# Patient Record
Sex: Female | Born: 1950 | Race: White | Hispanic: No | Smoking: Never smoker
Health system: Southern US, Community
[De-identification: ages and names within clinical notes are randomized; demographics above are authoritative.]

## PROBLEM LIST (undated history)

## (undated) DIAGNOSIS — I10 Essential (primary) hypertension: Secondary | ICD-10-CM

## (undated) DIAGNOSIS — E079 Disorder of thyroid, unspecified: Secondary | ICD-10-CM

## (undated) HISTORY — PX: TUBAL LIGATION: SHX77

---

## 2017-01-03 DIAGNOSIS — E782 Mixed hyperlipidemia: Secondary | ICD-10-CM | POA: Diagnosis not present

## 2017-01-03 DIAGNOSIS — R55 Syncope and collapse: Secondary | ICD-10-CM | POA: Diagnosis not present

## 2017-01-03 DIAGNOSIS — D539 Nutritional anemia, unspecified: Secondary | ICD-10-CM | POA: Diagnosis not present

## 2017-01-03 DIAGNOSIS — I10 Essential (primary) hypertension: Secondary | ICD-10-CM | POA: Diagnosis not present

## 2017-01-05 DIAGNOSIS — E782 Mixed hyperlipidemia: Secondary | ICD-10-CM | POA: Diagnosis not present

## 2017-01-05 DIAGNOSIS — M545 Low back pain: Secondary | ICD-10-CM | POA: Diagnosis not present

## 2017-01-05 DIAGNOSIS — I10 Essential (primary) hypertension: Secondary | ICD-10-CM | POA: Diagnosis not present

## 2017-01-05 DIAGNOSIS — D1724 Benign lipomatous neoplasm of skin and subcutaneous tissue of left leg: Secondary | ICD-10-CM | POA: Diagnosis not present

## 2017-01-05 DIAGNOSIS — Z23 Encounter for immunization: Secondary | ICD-10-CM | POA: Diagnosis not present

## 2017-07-17 DIAGNOSIS — D649 Anemia, unspecified: Secondary | ICD-10-CM | POA: Diagnosis not present

## 2017-07-17 DIAGNOSIS — E782 Mixed hyperlipidemia: Secondary | ICD-10-CM | POA: Diagnosis not present

## 2017-07-17 DIAGNOSIS — D529 Folate deficiency anemia, unspecified: Secondary | ICD-10-CM | POA: Diagnosis not present

## 2017-07-17 DIAGNOSIS — Z9189 Other specified personal risk factors, not elsewhere classified: Secondary | ICD-10-CM | POA: Diagnosis not present

## 2017-07-17 DIAGNOSIS — D519 Vitamin B12 deficiency anemia, unspecified: Secondary | ICD-10-CM | POA: Diagnosis not present

## 2017-07-17 DIAGNOSIS — I10 Essential (primary) hypertension: Secondary | ICD-10-CM | POA: Diagnosis not present

## 2017-07-19 DIAGNOSIS — Z0001 Encounter for general adult medical examination with abnormal findings: Secondary | ICD-10-CM | POA: Diagnosis not present

## 2017-07-19 DIAGNOSIS — Z23 Encounter for immunization: Secondary | ICD-10-CM | POA: Diagnosis not present

## 2017-07-19 DIAGNOSIS — D1724 Benign lipomatous neoplasm of skin and subcutaneous tissue of left leg: Secondary | ICD-10-CM | POA: Diagnosis not present

## 2017-07-19 DIAGNOSIS — Z1389 Encounter for screening for other disorder: Secondary | ICD-10-CM | POA: Diagnosis not present

## 2017-07-19 DIAGNOSIS — I10 Essential (primary) hypertension: Secondary | ICD-10-CM | POA: Diagnosis not present

## 2017-07-19 DIAGNOSIS — E782 Mixed hyperlipidemia: Secondary | ICD-10-CM | POA: Diagnosis not present

## 2017-08-29 DIAGNOSIS — D1723 Benign lipomatous neoplasm of skin and subcutaneous tissue of right leg: Secondary | ICD-10-CM | POA: Diagnosis not present

## 2017-08-29 DIAGNOSIS — I10 Essential (primary) hypertension: Secondary | ICD-10-CM | POA: Diagnosis not present

## 2017-08-29 DIAGNOSIS — M545 Low back pain: Secondary | ICD-10-CM | POA: Diagnosis not present

## 2017-08-29 DIAGNOSIS — E782 Mixed hyperlipidemia: Secondary | ICD-10-CM | POA: Diagnosis not present

## 2017-08-29 DIAGNOSIS — D1724 Benign lipomatous neoplasm of skin and subcutaneous tissue of left leg: Secondary | ICD-10-CM | POA: Diagnosis not present

## 2017-10-10 DIAGNOSIS — Z1231 Encounter for screening mammogram for malignant neoplasm of breast: Secondary | ICD-10-CM | POA: Diagnosis not present

## 2017-10-17 DIAGNOSIS — H25013 Cortical age-related cataract, bilateral: Secondary | ICD-10-CM | POA: Diagnosis not present

## 2017-11-16 DIAGNOSIS — H5703 Miosis: Secondary | ICD-10-CM | POA: Diagnosis not present

## 2017-11-16 DIAGNOSIS — H2513 Age-related nuclear cataract, bilateral: Secondary | ICD-10-CM | POA: Diagnosis not present

## 2017-11-16 DIAGNOSIS — H3509 Other intraretinal microvascular abnormalities: Secondary | ICD-10-CM | POA: Diagnosis not present

## 2017-11-16 DIAGNOSIS — H25013 Cortical age-related cataract, bilateral: Secondary | ICD-10-CM | POA: Diagnosis not present

## 2017-12-19 DIAGNOSIS — H2512 Age-related nuclear cataract, left eye: Secondary | ICD-10-CM | POA: Diagnosis not present

## 2017-12-19 DIAGNOSIS — H25812 Combined forms of age-related cataract, left eye: Secondary | ICD-10-CM | POA: Diagnosis not present

## 2018-01-10 DIAGNOSIS — H2511 Age-related nuclear cataract, right eye: Secondary | ICD-10-CM | POA: Diagnosis not present

## 2018-01-10 DIAGNOSIS — H25011 Cortical age-related cataract, right eye: Secondary | ICD-10-CM | POA: Diagnosis not present

## 2018-01-16 DIAGNOSIS — H25811 Combined forms of age-related cataract, right eye: Secondary | ICD-10-CM | POA: Diagnosis not present

## 2018-01-16 DIAGNOSIS — H2511 Age-related nuclear cataract, right eye: Secondary | ICD-10-CM | POA: Diagnosis not present

## 2018-01-24 DIAGNOSIS — I10 Essential (primary) hypertension: Secondary | ICD-10-CM | POA: Diagnosis not present

## 2018-01-24 DIAGNOSIS — E782 Mixed hyperlipidemia: Secondary | ICD-10-CM | POA: Diagnosis not present

## 2018-01-24 DIAGNOSIS — Z9189 Other specified personal risk factors, not elsewhere classified: Secondary | ICD-10-CM | POA: Diagnosis not present

## 2018-01-24 DIAGNOSIS — D529 Folate deficiency anemia, unspecified: Secondary | ICD-10-CM | POA: Diagnosis not present

## 2018-01-24 DIAGNOSIS — D519 Vitamin B12 deficiency anemia, unspecified: Secondary | ICD-10-CM | POA: Diagnosis not present

## 2018-01-26 DIAGNOSIS — I10 Essential (primary) hypertension: Secondary | ICD-10-CM | POA: Diagnosis not present

## 2018-01-26 DIAGNOSIS — E782 Mixed hyperlipidemia: Secondary | ICD-10-CM | POA: Diagnosis not present

## 2018-01-26 DIAGNOSIS — D1723 Benign lipomatous neoplasm of skin and subcutaneous tissue of right leg: Secondary | ICD-10-CM | POA: Diagnosis not present

## 2018-01-26 DIAGNOSIS — M545 Low back pain: Secondary | ICD-10-CM | POA: Diagnosis not present

## 2018-01-26 DIAGNOSIS — D1724 Benign lipomatous neoplasm of skin and subcutaneous tissue of left leg: Secondary | ICD-10-CM | POA: Diagnosis not present

## 2018-07-20 DIAGNOSIS — D519 Vitamin B12 deficiency anemia, unspecified: Secondary | ICD-10-CM | POA: Diagnosis not present

## 2018-07-20 DIAGNOSIS — D649 Anemia, unspecified: Secondary | ICD-10-CM | POA: Diagnosis not present

## 2018-07-20 DIAGNOSIS — E782 Mixed hyperlipidemia: Secondary | ICD-10-CM | POA: Diagnosis not present

## 2018-07-20 DIAGNOSIS — I10 Essential (primary) hypertension: Secondary | ICD-10-CM | POA: Diagnosis not present

## 2018-07-20 DIAGNOSIS — D529 Folate deficiency anemia, unspecified: Secondary | ICD-10-CM | POA: Diagnosis not present

## 2018-07-24 DIAGNOSIS — Z1212 Encounter for screening for malignant neoplasm of rectum: Secondary | ICD-10-CM | POA: Diagnosis not present

## 2018-07-24 DIAGNOSIS — Z0001 Encounter for general adult medical examination with abnormal findings: Secondary | ICD-10-CM | POA: Diagnosis not present

## 2018-07-24 DIAGNOSIS — Z6823 Body mass index (BMI) 23.0-23.9, adult: Secondary | ICD-10-CM | POA: Diagnosis not present

## 2018-07-24 DIAGNOSIS — Z23 Encounter for immunization: Secondary | ICD-10-CM | POA: Diagnosis not present

## 2018-07-24 DIAGNOSIS — I10 Essential (primary) hypertension: Secondary | ICD-10-CM | POA: Diagnosis not present

## 2018-08-14 DIAGNOSIS — M8588 Other specified disorders of bone density and structure, other site: Secondary | ICD-10-CM | POA: Diagnosis not present

## 2018-08-14 DIAGNOSIS — M85832 Other specified disorders of bone density and structure, left forearm: Secondary | ICD-10-CM | POA: Diagnosis not present

## 2018-09-10 DIAGNOSIS — E039 Hypothyroidism, unspecified: Secondary | ICD-10-CM | POA: Diagnosis not present

## 2018-09-18 DIAGNOSIS — Z961 Presence of intraocular lens: Secondary | ICD-10-CM | POA: Diagnosis not present

## 2018-11-13 DIAGNOSIS — N329 Bladder disorder, unspecified: Secondary | ICD-10-CM | POA: Diagnosis not present

## 2018-12-06 DIAGNOSIS — N329 Bladder disorder, unspecified: Secondary | ICD-10-CM | POA: Diagnosis not present

## 2019-01-18 DIAGNOSIS — I10 Essential (primary) hypertension: Secondary | ICD-10-CM | POA: Diagnosis not present

## 2019-01-18 DIAGNOSIS — E039 Hypothyroidism, unspecified: Secondary | ICD-10-CM | POA: Diagnosis not present

## 2019-01-18 DIAGNOSIS — D529 Folate deficiency anemia, unspecified: Secondary | ICD-10-CM | POA: Diagnosis not present

## 2019-01-18 DIAGNOSIS — D649 Anemia, unspecified: Secondary | ICD-10-CM | POA: Diagnosis not present

## 2019-01-18 DIAGNOSIS — D519 Vitamin B12 deficiency anemia, unspecified: Secondary | ICD-10-CM | POA: Diagnosis not present

## 2019-01-21 DIAGNOSIS — I10 Essential (primary) hypertension: Secondary | ICD-10-CM | POA: Diagnosis not present

## 2019-01-21 DIAGNOSIS — M545 Low back pain: Secondary | ICD-10-CM | POA: Diagnosis not present

## 2019-01-21 DIAGNOSIS — E039 Hypothyroidism, unspecified: Secondary | ICD-10-CM | POA: Diagnosis not present

## 2019-01-21 DIAGNOSIS — E782 Mixed hyperlipidemia: Secondary | ICD-10-CM | POA: Diagnosis not present

## 2019-01-21 DIAGNOSIS — D1724 Benign lipomatous neoplasm of skin and subcutaneous tissue of left leg: Secondary | ICD-10-CM | POA: Diagnosis not present

## 2019-07-19 DIAGNOSIS — I10 Essential (primary) hypertension: Secondary | ICD-10-CM | POA: Diagnosis not present

## 2019-07-19 DIAGNOSIS — E782 Mixed hyperlipidemia: Secondary | ICD-10-CM | POA: Diagnosis not present

## 2019-07-26 DIAGNOSIS — E039 Hypothyroidism, unspecified: Secondary | ICD-10-CM | POA: Diagnosis not present

## 2019-07-26 DIAGNOSIS — D519 Vitamin B12 deficiency anemia, unspecified: Secondary | ICD-10-CM | POA: Diagnosis not present

## 2019-07-26 DIAGNOSIS — I10 Essential (primary) hypertension: Secondary | ICD-10-CM | POA: Diagnosis not present

## 2019-07-26 DIAGNOSIS — E782 Mixed hyperlipidemia: Secondary | ICD-10-CM | POA: Diagnosis not present

## 2019-07-26 DIAGNOSIS — D529 Folate deficiency anemia, unspecified: Secondary | ICD-10-CM | POA: Diagnosis not present

## 2019-07-29 DIAGNOSIS — I10 Essential (primary) hypertension: Secondary | ICD-10-CM | POA: Diagnosis not present

## 2019-07-29 DIAGNOSIS — D1724 Benign lipomatous neoplasm of skin and subcutaneous tissue of left leg: Secondary | ICD-10-CM | POA: Diagnosis not present

## 2019-07-29 DIAGNOSIS — E039 Hypothyroidism, unspecified: Secondary | ICD-10-CM | POA: Diagnosis not present

## 2019-07-29 DIAGNOSIS — Z0001 Encounter for general adult medical examination with abnormal findings: Secondary | ICD-10-CM | POA: Diagnosis not present

## 2019-07-29 DIAGNOSIS — Z23 Encounter for immunization: Secondary | ICD-10-CM | POA: Diagnosis not present

## 2019-07-29 DIAGNOSIS — Z6823 Body mass index (BMI) 23.0-23.9, adult: Secondary | ICD-10-CM | POA: Diagnosis not present

## 2019-07-29 DIAGNOSIS — E782 Mixed hyperlipidemia: Secondary | ICD-10-CM | POA: Diagnosis not present

## 2019-07-29 DIAGNOSIS — Z1212 Encounter for screening for malignant neoplasm of rectum: Secondary | ICD-10-CM | POA: Diagnosis not present

## 2019-07-30 ENCOUNTER — Other Ambulatory Visit: Payer: Self-pay

## 2019-07-30 NOTE — Patient Outreach (Signed)
[facility]) Care Management  07/30/2019  ILDA KREITZ [birth year] [id number]   Medication Adherence call to Mrs. Gwenlyn Perking patients telephone number belongs to someone else patient is showing past due on Lovastatin 20 mg and Lisinopril/Hctz 10/12.5 mg under [facility].   [facility] Management Direct Dial [phone]  Fax [phone] [email or web address]

## 2019-08-19 DIAGNOSIS — E039 Hypothyroidism, unspecified: Secondary | ICD-10-CM | POA: Diagnosis not present

## 2019-08-19 DIAGNOSIS — E782 Mixed hyperlipidemia: Secondary | ICD-10-CM | POA: Diagnosis not present

## 2019-08-19 DIAGNOSIS — I10 Essential (primary) hypertension: Secondary | ICD-10-CM | POA: Diagnosis not present

## 2019-09-19 DIAGNOSIS — E782 Mixed hyperlipidemia: Secondary | ICD-10-CM | POA: Diagnosis not present

## 2019-09-19 DIAGNOSIS — I10 Essential (primary) hypertension: Secondary | ICD-10-CM | POA: Diagnosis not present

## 2019-09-30 DIAGNOSIS — H5212 Myopia, left eye: Secondary | ICD-10-CM | POA: Diagnosis not present

## 2019-10-11 DIAGNOSIS — Z1231 Encounter for screening mammogram for malignant neoplasm of breast: Secondary | ICD-10-CM | POA: Diagnosis not present

## 2019-10-18 DIAGNOSIS — E039 Hypothyroidism, unspecified: Secondary | ICD-10-CM | POA: Diagnosis not present

## 2019-10-18 DIAGNOSIS — I10 Essential (primary) hypertension: Secondary | ICD-10-CM | POA: Diagnosis not present

## 2019-11-13 DIAGNOSIS — N6321 Unspecified lump in the left breast, upper outer quadrant: Secondary | ICD-10-CM | POA: Diagnosis not present

## 2019-11-13 DIAGNOSIS — N6002 Solitary cyst of left breast: Secondary | ICD-10-CM | POA: Diagnosis not present

## 2019-11-13 DIAGNOSIS — N6489 Other specified disorders of breast: Secondary | ICD-10-CM | POA: Diagnosis not present

## 2019-11-13 DIAGNOSIS — R922 Inconclusive mammogram: Secondary | ICD-10-CM | POA: Diagnosis not present

## 2019-11-14 DIAGNOSIS — M25561 Pain in right knee: Secondary | ICD-10-CM | POA: Diagnosis not present

## 2019-11-14 DIAGNOSIS — R21 Rash and other nonspecific skin eruption: Secondary | ICD-10-CM | POA: Diagnosis not present

## 2019-11-15 DIAGNOSIS — E7849 Other hyperlipidemia: Secondary | ICD-10-CM | POA: Diagnosis not present

## 2019-11-15 DIAGNOSIS — I10 Essential (primary) hypertension: Secondary | ICD-10-CM | POA: Diagnosis not present

## 2019-11-27 DIAGNOSIS — N6002 Solitary cyst of left breast: Secondary | ICD-10-CM | POA: Diagnosis not present

## 2019-12-16 DIAGNOSIS — M7061 Trochanteric bursitis, right hip: Secondary | ICD-10-CM | POA: Diagnosis not present

## 2019-12-16 DIAGNOSIS — Z6823 Body mass index (BMI) 23.0-23.9, adult: Secondary | ICD-10-CM | POA: Diagnosis not present

## 2019-12-16 DIAGNOSIS — M7062 Trochanteric bursitis, left hip: Secondary | ICD-10-CM | POA: Diagnosis not present

## 2019-12-16 DIAGNOSIS — M25561 Pain in right knee: Secondary | ICD-10-CM | POA: Diagnosis not present

## 2019-12-16 DIAGNOSIS — E782 Mixed hyperlipidemia: Secondary | ICD-10-CM | POA: Diagnosis not present

## 2019-12-18 DIAGNOSIS — I10 Essential (primary) hypertension: Secondary | ICD-10-CM | POA: Diagnosis not present

## 2019-12-18 DIAGNOSIS — E7849 Other hyperlipidemia: Secondary | ICD-10-CM | POA: Diagnosis not present

## 2020-01-24 DIAGNOSIS — I10 Essential (primary) hypertension: Secondary | ICD-10-CM | POA: Diagnosis not present

## 2020-01-24 DIAGNOSIS — E782 Mixed hyperlipidemia: Secondary | ICD-10-CM | POA: Diagnosis not present

## 2020-01-31 DIAGNOSIS — E039 Hypothyroidism, unspecified: Secondary | ICD-10-CM | POA: Diagnosis not present

## 2020-01-31 DIAGNOSIS — M545 Low back pain: Secondary | ICD-10-CM | POA: Diagnosis not present

## 2020-01-31 DIAGNOSIS — E782 Mixed hyperlipidemia: Secondary | ICD-10-CM | POA: Diagnosis not present

## 2020-01-31 DIAGNOSIS — Z6823 Body mass index (BMI) 23.0-23.9, adult: Secondary | ICD-10-CM | POA: Diagnosis not present

## 2020-01-31 DIAGNOSIS — I10 Essential (primary) hypertension: Secondary | ICD-10-CM | POA: Diagnosis not present

## 2020-01-31 DIAGNOSIS — D1724 Benign lipomatous neoplasm of skin and subcutaneous tissue of left leg: Secondary | ICD-10-CM | POA: Diagnosis not present

## 2020-02-17 DIAGNOSIS — E039 Hypothyroidism, unspecified: Secondary | ICD-10-CM | POA: Diagnosis not present

## 2020-02-17 DIAGNOSIS — I10 Essential (primary) hypertension: Secondary | ICD-10-CM | POA: Diagnosis not present

## 2020-02-17 DIAGNOSIS — E7849 Other hyperlipidemia: Secondary | ICD-10-CM | POA: Diagnosis not present

## 2020-02-17 DIAGNOSIS — D649 Anemia, unspecified: Secondary | ICD-10-CM | POA: Diagnosis not present

## 2020-03-18 DIAGNOSIS — D649 Anemia, unspecified: Secondary | ICD-10-CM | POA: Diagnosis not present

## 2020-03-18 DIAGNOSIS — E039 Hypothyroidism, unspecified: Secondary | ICD-10-CM | POA: Diagnosis not present

## 2020-03-18 DIAGNOSIS — E7849 Other hyperlipidemia: Secondary | ICD-10-CM | POA: Diagnosis not present

## 2020-03-18 DIAGNOSIS — I10 Essential (primary) hypertension: Secondary | ICD-10-CM | POA: Diagnosis not present

## 2020-05-19 DIAGNOSIS — I10 Essential (primary) hypertension: Secondary | ICD-10-CM | POA: Diagnosis not present

## 2020-05-19 DIAGNOSIS — E7849 Other hyperlipidemia: Secondary | ICD-10-CM | POA: Diagnosis not present

## 2020-05-19 DIAGNOSIS — D649 Anemia, unspecified: Secondary | ICD-10-CM | POA: Diagnosis not present

## 2020-05-19 DIAGNOSIS — E039 Hypothyroidism, unspecified: Secondary | ICD-10-CM | POA: Diagnosis not present

## 2020-06-18 DIAGNOSIS — E039 Hypothyroidism, unspecified: Secondary | ICD-10-CM | POA: Diagnosis not present

## 2020-06-18 DIAGNOSIS — E7849 Other hyperlipidemia: Secondary | ICD-10-CM | POA: Diagnosis not present

## 2020-06-18 DIAGNOSIS — D649 Anemia, unspecified: Secondary | ICD-10-CM | POA: Diagnosis not present

## 2020-06-18 DIAGNOSIS — I10 Essential (primary) hypertension: Secondary | ICD-10-CM | POA: Diagnosis not present

## 2020-08-11 DIAGNOSIS — E039 Hypothyroidism, unspecified: Secondary | ICD-10-CM | POA: Diagnosis not present

## 2020-08-11 DIAGNOSIS — E782 Mixed hyperlipidemia: Secondary | ICD-10-CM | POA: Diagnosis not present

## 2020-08-11 DIAGNOSIS — I10 Essential (primary) hypertension: Secondary | ICD-10-CM | POA: Diagnosis not present

## 2020-08-11 DIAGNOSIS — D649 Anemia, unspecified: Secondary | ICD-10-CM | POA: Diagnosis not present

## 2020-08-18 DIAGNOSIS — E782 Mixed hyperlipidemia: Secondary | ICD-10-CM | POA: Diagnosis not present

## 2020-08-18 DIAGNOSIS — I10 Essential (primary) hypertension: Secondary | ICD-10-CM | POA: Diagnosis not present

## 2020-08-18 DIAGNOSIS — Z0001 Encounter for general adult medical examination with abnormal findings: Secondary | ICD-10-CM | POA: Diagnosis not present

## 2020-08-18 DIAGNOSIS — E7849 Other hyperlipidemia: Secondary | ICD-10-CM | POA: Diagnosis not present

## 2020-08-18 DIAGNOSIS — Z23 Encounter for immunization: Secondary | ICD-10-CM | POA: Diagnosis not present

## 2020-08-18 DIAGNOSIS — E039 Hypothyroidism, unspecified: Secondary | ICD-10-CM | POA: Diagnosis not present

## 2020-08-18 DIAGNOSIS — Z1212 Encounter for screening for malignant neoplasm of rectum: Secondary | ICD-10-CM | POA: Diagnosis not present

## 2020-08-18 DIAGNOSIS — Z6824 Body mass index (BMI) 24.0-24.9, adult: Secondary | ICD-10-CM | POA: Diagnosis not present

## 2020-08-18 DIAGNOSIS — D1724 Benign lipomatous neoplasm of skin and subcutaneous tissue of left leg: Secondary | ICD-10-CM | POA: Diagnosis not present

## 2020-09-18 DIAGNOSIS — E039 Hypothyroidism, unspecified: Secondary | ICD-10-CM | POA: Diagnosis not present

## 2020-09-18 DIAGNOSIS — E7849 Other hyperlipidemia: Secondary | ICD-10-CM | POA: Diagnosis not present

## 2020-09-18 DIAGNOSIS — D649 Anemia, unspecified: Secondary | ICD-10-CM | POA: Diagnosis not present

## 2020-09-18 DIAGNOSIS — I10 Essential (primary) hypertension: Secondary | ICD-10-CM | POA: Diagnosis not present

## 2020-10-17 DIAGNOSIS — D649 Anemia, unspecified: Secondary | ICD-10-CM | POA: Diagnosis not present

## 2020-10-17 DIAGNOSIS — E039 Hypothyroidism, unspecified: Secondary | ICD-10-CM | POA: Diagnosis not present

## 2020-10-17 DIAGNOSIS — E7849 Other hyperlipidemia: Secondary | ICD-10-CM | POA: Diagnosis not present

## 2020-10-17 DIAGNOSIS — I10 Essential (primary) hypertension: Secondary | ICD-10-CM | POA: Diagnosis not present

## 2020-11-16 DIAGNOSIS — D649 Anemia, unspecified: Secondary | ICD-10-CM | POA: Diagnosis not present

## 2020-11-16 DIAGNOSIS — E039 Hypothyroidism, unspecified: Secondary | ICD-10-CM | POA: Diagnosis not present

## 2020-11-16 DIAGNOSIS — E7849 Other hyperlipidemia: Secondary | ICD-10-CM | POA: Diagnosis not present

## 2020-11-16 DIAGNOSIS — I10 Essential (primary) hypertension: Secondary | ICD-10-CM | POA: Diagnosis not present

## 2020-11-28 DIAGNOSIS — J069 Acute upper respiratory infection, unspecified: Secondary | ICD-10-CM | POA: Diagnosis not present

## 2020-11-28 DIAGNOSIS — J40 Bronchitis, not specified as acute or chronic: Secondary | ICD-10-CM | POA: Diagnosis not present

## 2020-12-15 DIAGNOSIS — Z1231 Encounter for screening mammogram for malignant neoplasm of breast: Secondary | ICD-10-CM | POA: Diagnosis not present

## 2020-12-16 DIAGNOSIS — E039 Hypothyroidism, unspecified: Secondary | ICD-10-CM | POA: Diagnosis not present

## 2020-12-16 DIAGNOSIS — D649 Anemia, unspecified: Secondary | ICD-10-CM | POA: Diagnosis not present

## 2020-12-16 DIAGNOSIS — I10 Essential (primary) hypertension: Secondary | ICD-10-CM | POA: Diagnosis not present

## 2020-12-16 DIAGNOSIS — E7849 Other hyperlipidemia: Secondary | ICD-10-CM | POA: Diagnosis not present

## 2021-01-15 DIAGNOSIS — H524 Presbyopia: Secondary | ICD-10-CM | POA: Diagnosis not present

## 2021-02-08 DIAGNOSIS — E7849 Other hyperlipidemia: Secondary | ICD-10-CM | POA: Diagnosis not present

## 2021-02-08 DIAGNOSIS — E782 Mixed hyperlipidemia: Secondary | ICD-10-CM | POA: Diagnosis not present

## 2021-02-08 DIAGNOSIS — E039 Hypothyroidism, unspecified: Secondary | ICD-10-CM | POA: Diagnosis not present

## 2021-02-08 DIAGNOSIS — D519 Vitamin B12 deficiency anemia, unspecified: Secondary | ICD-10-CM | POA: Diagnosis not present

## 2021-02-08 DIAGNOSIS — I10 Essential (primary) hypertension: Secondary | ICD-10-CM | POA: Diagnosis not present

## 2021-02-11 DIAGNOSIS — E039 Hypothyroidism, unspecified: Secondary | ICD-10-CM | POA: Diagnosis not present

## 2021-02-11 DIAGNOSIS — Z6823 Body mass index (BMI) 23.0-23.9, adult: Secondary | ICD-10-CM | POA: Diagnosis not present

## 2021-02-11 DIAGNOSIS — S39012A Strain of muscle, fascia and tendon of lower back, initial encounter: Secondary | ICD-10-CM | POA: Diagnosis not present

## 2021-02-11 DIAGNOSIS — Z23 Encounter for immunization: Secondary | ICD-10-CM | POA: Diagnosis not present

## 2021-02-11 DIAGNOSIS — I10 Essential (primary) hypertension: Secondary | ICD-10-CM | POA: Diagnosis not present

## 2021-02-11 DIAGNOSIS — E7849 Other hyperlipidemia: Secondary | ICD-10-CM | POA: Diagnosis not present

## 2021-02-11 DIAGNOSIS — D1724 Benign lipomatous neoplasm of skin and subcutaneous tissue of left leg: Secondary | ICD-10-CM | POA: Diagnosis not present

## 2021-03-08 DIAGNOSIS — H35362 Drusen (degenerative) of macula, left eye: Secondary | ICD-10-CM | POA: Diagnosis not present

## 2021-03-08 DIAGNOSIS — H26493 Other secondary cataract, bilateral: Secondary | ICD-10-CM | POA: Diagnosis not present

## 2021-03-08 DIAGNOSIS — H35033 Hypertensive retinopathy, bilateral: Secondary | ICD-10-CM | POA: Diagnosis not present

## 2021-03-08 DIAGNOSIS — H5703 Miosis: Secondary | ICD-10-CM | POA: Diagnosis not present

## 2021-03-18 DIAGNOSIS — E039 Hypothyroidism, unspecified: Secondary | ICD-10-CM | POA: Diagnosis not present

## 2021-03-18 DIAGNOSIS — E7849 Other hyperlipidemia: Secondary | ICD-10-CM | POA: Diagnosis not present

## 2021-03-18 DIAGNOSIS — I10 Essential (primary) hypertension: Secondary | ICD-10-CM | POA: Diagnosis not present

## 2021-03-18 DIAGNOSIS — D649 Anemia, unspecified: Secondary | ICD-10-CM | POA: Diagnosis not present

## 2021-04-01 DIAGNOSIS — H26492 Other secondary cataract, left eye: Secondary | ICD-10-CM | POA: Diagnosis not present

## 2021-08-20 DIAGNOSIS — I10 Essential (primary) hypertension: Secondary | ICD-10-CM | POA: Diagnosis not present

## 2021-08-20 DIAGNOSIS — Z0001 Encounter for general adult medical examination with abnormal findings: Secondary | ICD-10-CM | POA: Diagnosis not present

## 2021-08-20 DIAGNOSIS — E782 Mixed hyperlipidemia: Secondary | ICD-10-CM | POA: Diagnosis not present

## 2021-08-20 DIAGNOSIS — E039 Hypothyroidism, unspecified: Secondary | ICD-10-CM | POA: Diagnosis not present

## 2021-08-20 DIAGNOSIS — E7849 Other hyperlipidemia: Secondary | ICD-10-CM | POA: Diagnosis not present

## 2021-08-24 DIAGNOSIS — E039 Hypothyroidism, unspecified: Secondary | ICD-10-CM | POA: Diagnosis not present

## 2021-08-24 DIAGNOSIS — Z6824 Body mass index (BMI) 24.0-24.9, adult: Secondary | ICD-10-CM | POA: Diagnosis not present

## 2021-08-24 DIAGNOSIS — Z23 Encounter for immunization: Secondary | ICD-10-CM | POA: Diagnosis not present

## 2021-08-24 DIAGNOSIS — D1724 Benign lipomatous neoplasm of skin and subcutaneous tissue of left leg: Secondary | ICD-10-CM | POA: Diagnosis not present

## 2021-08-24 DIAGNOSIS — E7849 Other hyperlipidemia: Secondary | ICD-10-CM | POA: Diagnosis not present

## 2021-08-24 DIAGNOSIS — Z1212 Encounter for screening for malignant neoplasm of rectum: Secondary | ICD-10-CM | POA: Diagnosis not present

## 2021-08-24 DIAGNOSIS — Z0001 Encounter for general adult medical examination with abnormal findings: Secondary | ICD-10-CM | POA: Diagnosis not present

## 2021-08-24 DIAGNOSIS — I10 Essential (primary) hypertension: Secondary | ICD-10-CM | POA: Diagnosis not present

## 2021-09-07 DIAGNOSIS — H26493 Other secondary cataract, bilateral: Secondary | ICD-10-CM | POA: Diagnosis not present

## 2021-09-13 DIAGNOSIS — L570 Actinic keratosis: Secondary | ICD-10-CM | POA: Diagnosis not present

## 2022-01-15 ENCOUNTER — Emergency Department (HOSPITAL_COMMUNITY): Payer: Medicare Other

## 2022-01-15 ENCOUNTER — Emergency Department (HOSPITAL_COMMUNITY)
Admission: EM | Admit: 2022-01-15 | Discharge: 2022-01-15 | Disposition: A | Discharge status: Home / Self Care | Payer: Medicare Other | Attending: Student | Admitting: Student

## 2022-01-15 ENCOUNTER — Other Ambulatory Visit: Payer: Self-pay

## 2022-01-15 ENCOUNTER — Encounter (HOSPITAL_COMMUNITY): Payer: Self-pay

## 2022-01-15 DIAGNOSIS — I10 Essential (primary) hypertension: Secondary | ICD-10-CM | POA: Diagnosis not present

## 2022-01-15 DIAGNOSIS — E039 Hypothyroidism, unspecified: Secondary | ICD-10-CM | POA: Diagnosis not present

## 2022-01-15 DIAGNOSIS — R0789 Other chest pain: Secondary | ICD-10-CM | POA: Diagnosis not present

## 2022-01-15 DIAGNOSIS — J9811 Atelectasis: Secondary | ICD-10-CM | POA: Diagnosis not present

## 2022-01-15 DIAGNOSIS — R079 Chest pain, unspecified: Secondary | ICD-10-CM | POA: Insufficient documentation

## 2022-01-15 HISTORY — DX: Essential (primary) hypertension: I10

## 2022-01-15 LAB — CBC WITH DIFFERENTIAL/PLATELET
Abs Immature Granulocytes: 0.01 10*3/uL (ref 0.00–0.07)
Basophils Absolute: 0.1 10*3/uL (ref 0.0–0.1)
Basophils Relative: 1 %
Eosinophils Absolute: 0.1 10*3/uL (ref 0.0–0.5)
Eosinophils Relative: 1 %
HCT: 38.7 % (ref 36.0–46.0)
Hemoglobin: 13.2 g/dL (ref 12.0–15.0)
Immature Granulocytes: 0 %
Lymphocytes Relative: 25 %
Lymphs Abs: 1.7 10*3/uL (ref 0.7–4.0)
MCH: 30.8 pg (ref 26.0–34.0)
MCHC: 34.1 g/dL (ref 30.0–36.0)
MCV: 90.4 fL (ref 80.0–100.0)
Monocytes Absolute: 0.5 10*3/uL (ref 0.1–1.0)
Monocytes Relative: 7 %
Neutro Abs: 4.4 10*3/uL (ref 1.7–7.7)
Neutrophils Relative %: 66 %
Platelets: 184 10*3/uL (ref 150–400)
RBC: 4.28 MIL/uL (ref 3.87–5.11)
RDW: 13.5 % (ref 11.5–15.5)
WBC: 6.7 10*3/uL (ref 4.0–10.5)
nRBC: 0 % (ref 0.0–0.2)

## 2022-01-15 LAB — BASIC METABOLIC PANEL
Anion gap: 6 (ref 5–15)
BUN: 19 mg/dL (ref 8–23)
CO2: 26 mmol/L (ref 22–32)
Calcium: 9.5 mg/dL (ref 8.9–10.3)
Chloride: 108 mmol/L (ref 98–111)
Creatinine, Ser: 1.04 mg/dL — ABNORMAL HIGH (ref 0.44–1.00)
GFR, Estimated: 58 mL/min — ABNORMAL LOW (ref >60)
Glucose, Bld: 110 mg/dL — ABNORMAL HIGH (ref 70–99)
Potassium: 3.3 mmol/L — ABNORMAL LOW (ref 3.5–5.1)
Sodium: 140 mmol/L (ref 135–145)

## 2022-01-15 LAB — TROPONIN I (HIGH SENSITIVITY)
Troponin I (High Sensitivity): 4 ng/L (ref <18)
Troponin I (High Sensitivity): 5 ng/L (ref <18)

## 2022-01-15 MED ORDER — IOHEXOL 350 MG/ML SOLN
75.0000 mL | Freq: Once | INTRAVENOUS | Status: AC | PRN
  Administered 2022-01-15: 75 mL via INTRAVENOUS

## 2022-01-15 NOTE — Discharge Instructions (Signed)
Your work-up today was reassuring.  I would like for you to follow-up with your primary care doctor sometime within the next week or so.  Please call him Monday and see if and get your appointment moved up.  Please return to the [facility] apartment for any worsening symptoms you might have. ?

## 2022-01-15 NOTE — ED Triage Notes (Signed)
Patient reports that she woke with left sided chest pain this. Has taken '324mg'$  ASA.  ?

## 2022-01-15 NOTE — ED Provider Notes (Signed)
?[place] ?Provider Note ? ? ?CSN: [id number] ?Arrival date & time: 01/15/22  1755 ? ?  ? ?History ? ?Chief Complaint  ?Patient presents with  ? Chest Pain  ? ? ?Veronica Peck is a 72 y.o. female with history of hypertension, hyperlipidemia, and hypothyroidism who presents to the emergency department today with left-sided chest pain that has been constant since he woke up around 8 AM.  Nothing seems to make this pain better or worse.  She describes as a dull ache.  She denies nausea, vomiting, diarrhea, chills, diaphoresis, abdominal pain, shortness of breath.  Patient took 4 aspirin prior to arrival with little improvement. ? ? ?Chest Pain ? ?  ? ?Home Medications ?Prior to Admission medications   ?Not on File  ?   ? ?Allergies    ?Silvadene [silver sulfadiazine]   ? ?Review of Systems   ?Review of Systems  ?Cardiovascular:  Positive for chest pain.  ?All other systems reviewed and are negative. ? ?Physical Exam ?Updated Vital Signs ?BP 123/80   Pulse 72   Temp 98.8 ?F (37.1 ?C) (Oral)   Resp 15   Ht '5\' 5"'$  (1.651 m)   Wt 62.6 kg   SpO2 97%   BMI 22.96 kg/m?  ?Physical Exam ?Vitals and nursing note reviewed.  ?Constitutional:   ?   General: She is not in acute distress. ?   Appearance: Normal appearance.  ?HENT:  ?   Head: Normocephalic and atraumatic.  ?Eyes:  ?   General:     ?   Right eye: No discharge.     ?   Left eye: No discharge.  ?Cardiovascular:  ?   Comments: Regular rate and rhythm.  S1/S2 are distinct without any evidence of murmur, rubs, or gallops.  Radial pulses are 2+ bilaterally.  Dorsalis pedis pulses are 2+ bilaterally.  No evidence of pedal edema. ?Pulmonary:  ?   Comments: Clear to auscultation bilaterally.  Normal effort.  No respiratory distress.  No evidence of wheezes, rales, or rhonchi heard throughout. ?Abdominal:  ?   General: Abdomen is flat. Bowel sounds are normal. There is no distension.  ?   Tenderness: There is no abdominal tenderness. There is no  guarding or rebound.  ?Musculoskeletal:     ?   General: Normal range of motion.  ?   Cervical back: Neck supple.  ?Skin: ?   General: Skin is warm and dry.  ?   Findings: No rash.  ?Neurological:  ?   General: No focal deficit present.  ?   Mental Status: She is alert.  ?Psychiatric:     ?   Mood and Affect: Mood normal.     ?   Behavior: Behavior normal.  ? ? ?ED Results / Procedures / Treatments   ?Labs ?(all labs ordered are listed, but only abnormal results are displayed) ?Labs Reviewed  ?BASIC METABOLIC PANEL - Abnormal; Notable for the following components:  ?    Result Value  ? Potassium 3.3 (*)   ? Glucose, Bld 110 (*)   ? Creatinine, Ser 1.04 (*)   ? GFR, Estimated 58 (*)   ? All other components within normal limits  ?CBC WITH DIFFERENTIAL/PLATELET  ?TROPONIN I (HIGH SENSITIVITY)  ?TROPONIN I (HIGH SENSITIVITY)  ? ? ?EKG ?None ? ?Radiology ?DG Chest 2 View ? ?Result Date: 01/15/2022 ?CLINICAL DATA:  Chest pain EXAM: CHEST - 2 VIEW COMPARISON:  None. FINDINGS: Cardiac size is within normal limits. Thoracic aorta is  tortuous and ectatic. Lung fields are clear of any pulmonary edema or focal consolidation. There is no pleural effusion or pneumothorax. IMPRESSION: No active cardiopulmonary disease. Electronically Signed   By: Elmer Picker M.D.   On: 01/15/2022 18:57  ? ?CT Angio Chest PE W and/or Wo Contrast ? ?Result Date: 01/15/2022 ?CLINICAL DATA:  Left chest pain EXAM: CT ANGIOGRAPHY CHEST WITH CONTRAST TECHNIQUE: Multidetector CT imaging of the chest was performed using the standard protocol during bolus administration of intravenous contrast. Multiplanar CT image reconstructions and MIPs were obtained to evaluate the vascular anatomy. RADIATION DOSE REDUCTION: This exam was performed according to the departmental dose-optimization program which includes automated exposure control, adjustment of the mA and/or kV according to patient size and/or use of iterative reconstruction technique. CONTRAST:   37m OMNIPAQUE IOHEXOL 350 MG/ML SOLN COMPARISON:  Chest radiograph dated 01/13/2022 FINDINGS: Cardiovascular: Satisfactory opacification of the bilateral pulmonary arteries to the segmental level. Evaluation is mildly limited by respiratory motion. Within that constraint, there is no evidence of pulmonary embolism. Although not tailored for evaluation of the thoracic aorta, there is no evidence of thoracic aortic aneurysm or dissection. Atherosclerotic calcifications of the aortic arch. The heart is normal in size.  No pericardial effusion. Mediastinum/Nodes: No suspicious mediastinal lymphadenopathy. Visualized thyroid is unremarkable. Lungs/Pleura: Mild dependent atelectasis in the bilateral lower lobes. No focal consolidation. Evaluation lung parenchyma is mildly constrained by respiratory motion. Within that constraint, there are no suspicious pulmonary nodules. No pleural effusion or pneumothorax. Upper Abdomen: Visualized upper abdomen is grossly unremarkable. Musculoskeletal: Degenerative changes of the visualized thoracolumbar spine. Review of the MIP images confirms the above findings. IMPRESSION: No evidence of pulmonary embolism. No evidence of acute cardiopulmonary disease. Aortic Atherosclerosis (ICD10-I70.0). Electronically Signed   By: SJulian HyM.D.   On: 01/15/2022 22:01   ? ?Procedures ?Procedures  ? ? ?Medications Ordered in ED ?Medications  ?iohexol (OMNIPAQUE) 350 MG/ML injection 75 mL (75 mLs Intravenous Contrast Given 01/15/22 2147)  ? ? ?ED Course/ Medical Decision Making/ A&P ?Clinical Course as of 01/15/22 2227  ?Sat Jan 15, 2022  ?2227 I discussed this case with my attending physician who cosigned this note including patient's presenting symptoms, physical exam, and planned diagnostics and interventions. Attending physician stated agreement with plan or made changes to plan which were implemented.  ? ? [CF]  ?  ?Clinical Course User Index ?[CF] FMyna BrightM, PA-C  ? ?                         ?Medical Decision Making ?Amount and/or Complexity of Data Reviewed ?Labs: ordered. ?Radiology: ordered. ? ?Risk ?Prescription drug management. ? ? ?This patient presents to the ED for concern of left-sided chest pain, this involves an extensive number of treatment options, and is a complaint that carries with it a high risk of complications and morbidity.  The differential diagnosis includes ACS, dissection although less likely given her normal blood pressure, pulmonary embolism less likely she is not having any shortness of breath or evidence of tachycardia or syncope, pneumonia although unlikely as she is not having any cough or fever, pneumothorax and there is no unilateral breath sounds. ? ? ?Co morbidities that complicate the patient evaluation ? ?Past Medical History:  ?Diagnosis Date  ? Hypertension   ? ? ?Additional history obtained: ? ?Additional history obtained from nursing note.  External records reviewed and there is no records related to her visits today.  Providers are not in  the system ? ?Lab Tests: ? ?I Ordered, and personally interpreted labs.  The pertinent results include: CBC is normal.  BMP shows mild hypokalemia and elevated glucose.  Initial and delta troponin are negative. ? ? ?Imaging Studies ordered: ? ?I ordered imaging studies including chest x-ray and CT pulmonary embolism study ?I independently visualized and interpreted imaging which showed chest x-ray is negative for any pneumothorax or pneumonia.  CT angiography shows no pulmonary embolism ?I agree with the radiologist interpretation ? ? ?Cardiac Monitoring: ? ?The patient was maintained on a cardiac monitor.  I personally viewed and interpreted the cardiac monitored which showed an underlying rhythm of: Normal sinus rhythm ? ? ?Test Considered: ? ?N/A ? ? ?Critical Interventions: ? ?N/A ? ? ?Consultations Obtained: ? ?N/A ? ? ?Problem List / ED Course: ? ?Patient presents to the emergency department today with chest  pain.  Patient does not have a whole lot of risk factors for ACS apart from hypertension.  Work-up today is reassuring.  Troponins were negative x2.  No evidence of pulmonary embolism.  I will have her follo

## 2022-01-15 NOTE — ED Notes (Signed)
Patient transported to CT 

## 2022-02-23 DIAGNOSIS — I10 Essential (primary) hypertension: Secondary | ICD-10-CM | POA: Diagnosis not present

## 2022-02-23 DIAGNOSIS — E7849 Other hyperlipidemia: Secondary | ICD-10-CM | POA: Diagnosis not present

## 2022-02-23 DIAGNOSIS — E782 Mixed hyperlipidemia: Secondary | ICD-10-CM | POA: Diagnosis not present

## 2022-03-03 DIAGNOSIS — M545 Low back pain, unspecified: Secondary | ICD-10-CM | POA: Diagnosis not present

## 2022-03-03 DIAGNOSIS — Z6824 Body mass index (BMI) 24.0-24.9, adult: Secondary | ICD-10-CM | POA: Diagnosis not present

## 2022-03-03 DIAGNOSIS — E7849 Other hyperlipidemia: Secondary | ICD-10-CM | POA: Diagnosis not present

## 2022-03-03 DIAGNOSIS — I10 Essential (primary) hypertension: Secondary | ICD-10-CM | POA: Diagnosis not present

## 2022-03-03 DIAGNOSIS — D1724 Benign lipomatous neoplasm of skin and subcutaneous tissue of left leg: Secondary | ICD-10-CM | POA: Diagnosis not present

## 2022-03-03 DIAGNOSIS — E039 Hypothyroidism, unspecified: Secondary | ICD-10-CM | POA: Diagnosis not present

## 2022-09-02 DIAGNOSIS — D519 Vitamin B12 deficiency anemia, unspecified: Secondary | ICD-10-CM | POA: Diagnosis not present

## 2022-09-02 DIAGNOSIS — I10 Essential (primary) hypertension: Secondary | ICD-10-CM | POA: Diagnosis not present

## 2022-09-02 DIAGNOSIS — E7849 Other hyperlipidemia: Secondary | ICD-10-CM | POA: Diagnosis not present

## 2022-09-02 DIAGNOSIS — D649 Anemia, unspecified: Secondary | ICD-10-CM | POA: Diagnosis not present

## 2022-09-02 DIAGNOSIS — E039 Hypothyroidism, unspecified: Secondary | ICD-10-CM | POA: Diagnosis not present

## 2022-09-02 DIAGNOSIS — D529 Folate deficiency anemia, unspecified: Secondary | ICD-10-CM | POA: Diagnosis not present

## 2022-09-06 DIAGNOSIS — Z9849 Cataract extraction status, unspecified eye: Secondary | ICD-10-CM | POA: Diagnosis not present

## 2022-09-07 DIAGNOSIS — I10 Essential (primary) hypertension: Secondary | ICD-10-CM | POA: Diagnosis not present

## 2022-09-07 DIAGNOSIS — E039 Hypothyroidism, unspecified: Secondary | ICD-10-CM | POA: Diagnosis not present

## 2022-09-07 DIAGNOSIS — Z0001 Encounter for general adult medical examination with abnormal findings: Secondary | ICD-10-CM | POA: Diagnosis not present

## 2022-09-07 DIAGNOSIS — Z6824 Body mass index (BMI) 24.0-24.9, adult: Secondary | ICD-10-CM | POA: Diagnosis not present

## 2022-09-07 DIAGNOSIS — D1724 Benign lipomatous neoplasm of skin and subcutaneous tissue of left leg: Secondary | ICD-10-CM | POA: Diagnosis not present

## 2022-09-07 DIAGNOSIS — M545 Low back pain, unspecified: Secondary | ICD-10-CM | POA: Diagnosis not present

## 2022-09-07 DIAGNOSIS — E7849 Other hyperlipidemia: Secondary | ICD-10-CM | POA: Diagnosis not present

## 2022-09-07 DIAGNOSIS — Z23 Encounter for immunization: Secondary | ICD-10-CM | POA: Diagnosis not present

## 2022-09-21 DIAGNOSIS — Z1231 Encounter for screening mammogram for malignant neoplasm of breast: Secondary | ICD-10-CM | POA: Diagnosis not present

## 2022-11-16 DIAGNOSIS — M810 Age-related osteoporosis without current pathological fracture: Secondary | ICD-10-CM | POA: Diagnosis not present

## 2022-11-16 DIAGNOSIS — M8589 Other specified disorders of bone density and structure, multiple sites: Secondary | ICD-10-CM | POA: Diagnosis not present

## 2023-03-08 DIAGNOSIS — I10 Essential (primary) hypertension: Secondary | ICD-10-CM | POA: Diagnosis not present

## 2023-03-08 DIAGNOSIS — E7849 Other hyperlipidemia: Secondary | ICD-10-CM | POA: Diagnosis not present

## 2023-03-08 DIAGNOSIS — E039 Hypothyroidism, unspecified: Secondary | ICD-10-CM | POA: Diagnosis not present

## 2023-03-15 DIAGNOSIS — E039 Hypothyroidism, unspecified: Secondary | ICD-10-CM | POA: Diagnosis not present

## 2023-03-15 DIAGNOSIS — Z6824 Body mass index (BMI) 24.0-24.9, adult: Secondary | ICD-10-CM | POA: Diagnosis not present

## 2023-03-15 DIAGNOSIS — D1724 Benign lipomatous neoplasm of skin and subcutaneous tissue of left leg: Secondary | ICD-10-CM | POA: Diagnosis not present

## 2023-03-15 DIAGNOSIS — I10 Essential (primary) hypertension: Secondary | ICD-10-CM | POA: Diagnosis not present

## 2023-03-15 DIAGNOSIS — E7849 Other hyperlipidemia: Secondary | ICD-10-CM | POA: Diagnosis not present

## 2023-03-15 DIAGNOSIS — M545 Low back pain, unspecified: Secondary | ICD-10-CM | POA: Diagnosis not present

## 2023-03-15 DIAGNOSIS — Z23 Encounter for immunization: Secondary | ICD-10-CM | POA: Diagnosis not present

## 2023-06-01 DIAGNOSIS — N8110 Cystocele, unspecified: Secondary | ICD-10-CM | POA: Diagnosis not present

## 2023-09-07 DIAGNOSIS — H5212 Myopia, left eye: Secondary | ICD-10-CM | POA: Diagnosis not present

## 2023-09-29 DIAGNOSIS — E7849 Other hyperlipidemia: Secondary | ICD-10-CM | POA: Diagnosis not present

## 2023-09-29 DIAGNOSIS — Z0001 Encounter for general adult medical examination with abnormal findings: Secondary | ICD-10-CM | POA: Diagnosis not present

## 2023-09-29 DIAGNOSIS — I10 Essential (primary) hypertension: Secondary | ICD-10-CM | POA: Diagnosis not present

## 2023-09-29 DIAGNOSIS — Z131 Encounter for screening for diabetes mellitus: Secondary | ICD-10-CM | POA: Diagnosis not present

## 2023-09-29 DIAGNOSIS — E039 Hypothyroidism, unspecified: Secondary | ICD-10-CM | POA: Diagnosis not present

## 2023-09-29 DIAGNOSIS — E559 Vitamin D deficiency, unspecified: Secondary | ICD-10-CM | POA: Diagnosis not present

## 2023-10-06 DIAGNOSIS — I10 Essential (primary) hypertension: Secondary | ICD-10-CM | POA: Diagnosis not present

## 2023-10-06 DIAGNOSIS — Z0001 Encounter for general adult medical examination with abnormal findings: Secondary | ICD-10-CM | POA: Diagnosis not present

## 2023-10-06 DIAGNOSIS — Z6824 Body mass index (BMI) 24.0-24.9, adult: Secondary | ICD-10-CM | POA: Diagnosis not present

## 2023-10-06 DIAGNOSIS — Z23 Encounter for immunization: Secondary | ICD-10-CM | POA: Diagnosis not present

## 2023-10-06 DIAGNOSIS — E782 Mixed hyperlipidemia: Secondary | ICD-10-CM | POA: Diagnosis not present

## 2023-10-06 DIAGNOSIS — R030 Elevated blood-pressure reading, without diagnosis of hypertension: Secondary | ICD-10-CM | POA: Diagnosis not present

## 2023-10-06 DIAGNOSIS — M1711 Unilateral primary osteoarthritis, right knee: Secondary | ICD-10-CM | POA: Diagnosis not present

## 2023-10-06 DIAGNOSIS — E039 Hypothyroidism, unspecified: Secondary | ICD-10-CM | POA: Diagnosis not present

## 2023-12-14 ENCOUNTER — Encounter (HOSPITAL_COMMUNITY): Payer: Self-pay

## 2023-12-14 ENCOUNTER — Other Ambulatory Visit: Payer: Self-pay

## 2023-12-14 ENCOUNTER — Emergency Department (HOSPITAL_COMMUNITY)
Admission: EM | Admit: 2023-12-14 | Discharge: 2023-12-14 | Disposition: A | Discharge status: Home / Self Care | Attending: Emergency Medicine | Admitting: Emergency Medicine

## 2023-12-14 DIAGNOSIS — I10 Essential (primary) hypertension: Secondary | ICD-10-CM | POA: Diagnosis not present

## 2023-12-14 DIAGNOSIS — R519 Headache, unspecified: Secondary | ICD-10-CM | POA: Insufficient documentation

## 2023-12-14 DIAGNOSIS — Z79899 Other long term (current) drug therapy: Secondary | ICD-10-CM | POA: Insufficient documentation

## 2023-12-14 DIAGNOSIS — E039 Hypothyroidism, unspecified: Secondary | ICD-10-CM | POA: Diagnosis not present

## 2023-12-14 HISTORY — DX: Disorder of thyroid, unspecified: E07.9

## 2023-12-14 NOTE — Discharge Instructions (Addendum)
 Continue to use acetaminophen and/or naproxen as needed for your headache.  You may also try applying ice to the areas that are sore.  At any point, if your symptoms are concerning, please return for further evaluation.

## 2023-12-14 NOTE — ED Triage Notes (Signed)
 Pt reports having a headache, sore neck and dizziness sometimes when she stands up.

## 2023-12-14 NOTE — ED Provider Notes (Addendum)
 [place] EMERGENCY DEPARTMENT AT [facility] Provider Note   CSN: [id number] Arrival date & time: 12/14/23  2159     History  Chief Complaint  Patient presents with   Headache    Veronica Peck is a 73 y.o. female.  The history is provided by the patient.  Headache She has history of hypertension, hyperlipidemia, hypothyroidism and noted a frontal headache which started yesterday and got worse today.  She denies any visual change, weakness or dizziness or incoordination.  She did have some slight nausea yesterday but none today.  There is mild photophobia but no phonophobia.  She has taken acetaminophen which did give temporary relief of headache.  She states that she only has a very mild headache currently.  She denies fever or chills.  She was concerned because she checked her blood pressure at home and it got as high as 148 systolic, which is much higher than it usually is.   Home Medications Prior to Admission medications   Medication Sig Start Date End Date Taking? Authorizing Provider  cyclobenzaprine (FLEXERIL) 10 MG tablet 10 mg 3 (three) times daily as needed. 10/17/18  Yes [provider]  levothyroxine (SYNTHROID) 50 MCG tablet Take 50 mcg by mouth daily.    [provider]  lisinopril-hydrochlorothiazide (ZESTORETIC) 10-12.5 MG tablet Take 1 tablet by mouth daily.    [provider]  lovastatin (MEVACOR) 20 MG tablet Take 20 mg by mouth daily.    [provider]      Allergies    Silvadene [silver sulfadiazine]    Review of Systems   Review of Systems  Neurological:  Positive for headaches.  All other systems reviewed and are negative.   Physical Exam Updated Vital Signs BP (!) 151/84 (BP Location: Left Arm)   Pulse 100   Temp 98.1 F (36.7 C) (Oral)   Resp 16   Ht 5\' 5"  (1.651 m)   Wt 64 kg   SpO2 96%   BMI 23.46 kg/m  Physical Exam Vitals and nursing note reviewed.   73 year old female, resting  comfortably and in no acute distress. Vital signs are significant for mildly elevated blood pressure. Oxygen saturation is 96%, which is normal. Head is normocephalic and atraumatic. PERRLA, EOMI. There is mild tenderness on palpation of the temporalis muscles bilaterally and the insertion of the paracervical muscles bilaterally. Neck is nontender and supple without adenopathy. Lungs are clear without rales, wheezes, or rhonchi. Chest is nontender. Heart has regular rate and rhythm without murmur. Abdomen is soft, flat, nontender. Neurologic: Mental status is normal, cranial nerves are intact, strength is 5/5 in all 4 extremities.  ED Results / Procedures / Treatments    Procedures Procedures  Cardiac monitor shows normal sinus rhythm, per my interpretation.  Medications Ordered in ED Medications - No data to display  ED Course/ Medical Decision Making/ A&P                                 Medical Decision Making  Headache which seems to be a mild muscle contraction headache based on physical exam.  Blood pressure was mildly elevated at triage, has come down to normal since arriving in her room.  No need to change antihypertensive therapy.  Doubt subarachnoid hemorrhage, meningitis.  No need for imaging.  I offered the patient the option of a headache cocktail versus continuing using over-the-counter NSAIDs and acetaminophen at  home.  She is comfortable with continuing over-the-counter medications at home.  I have instructed her to return if symptoms are worsening.  Final Clinical Impression(s) / ED Diagnoses Final diagnoses:  Acute nonintractable headache, unspecified headache type    Rx / DC Orders ED Discharge Orders     None         Dione Booze, MD 12/14/23 2317    Dione Booze, MD 12/14/23 2325

## 2024-02-27 IMAGING — DX DG CHEST 2V
2 series · 2 of 2 positions shown · non-contrast
Comparison: None.

CLINICAL DATA: Chest pain

EXAM:
CHEST - 2 VIEW

[chest pa]
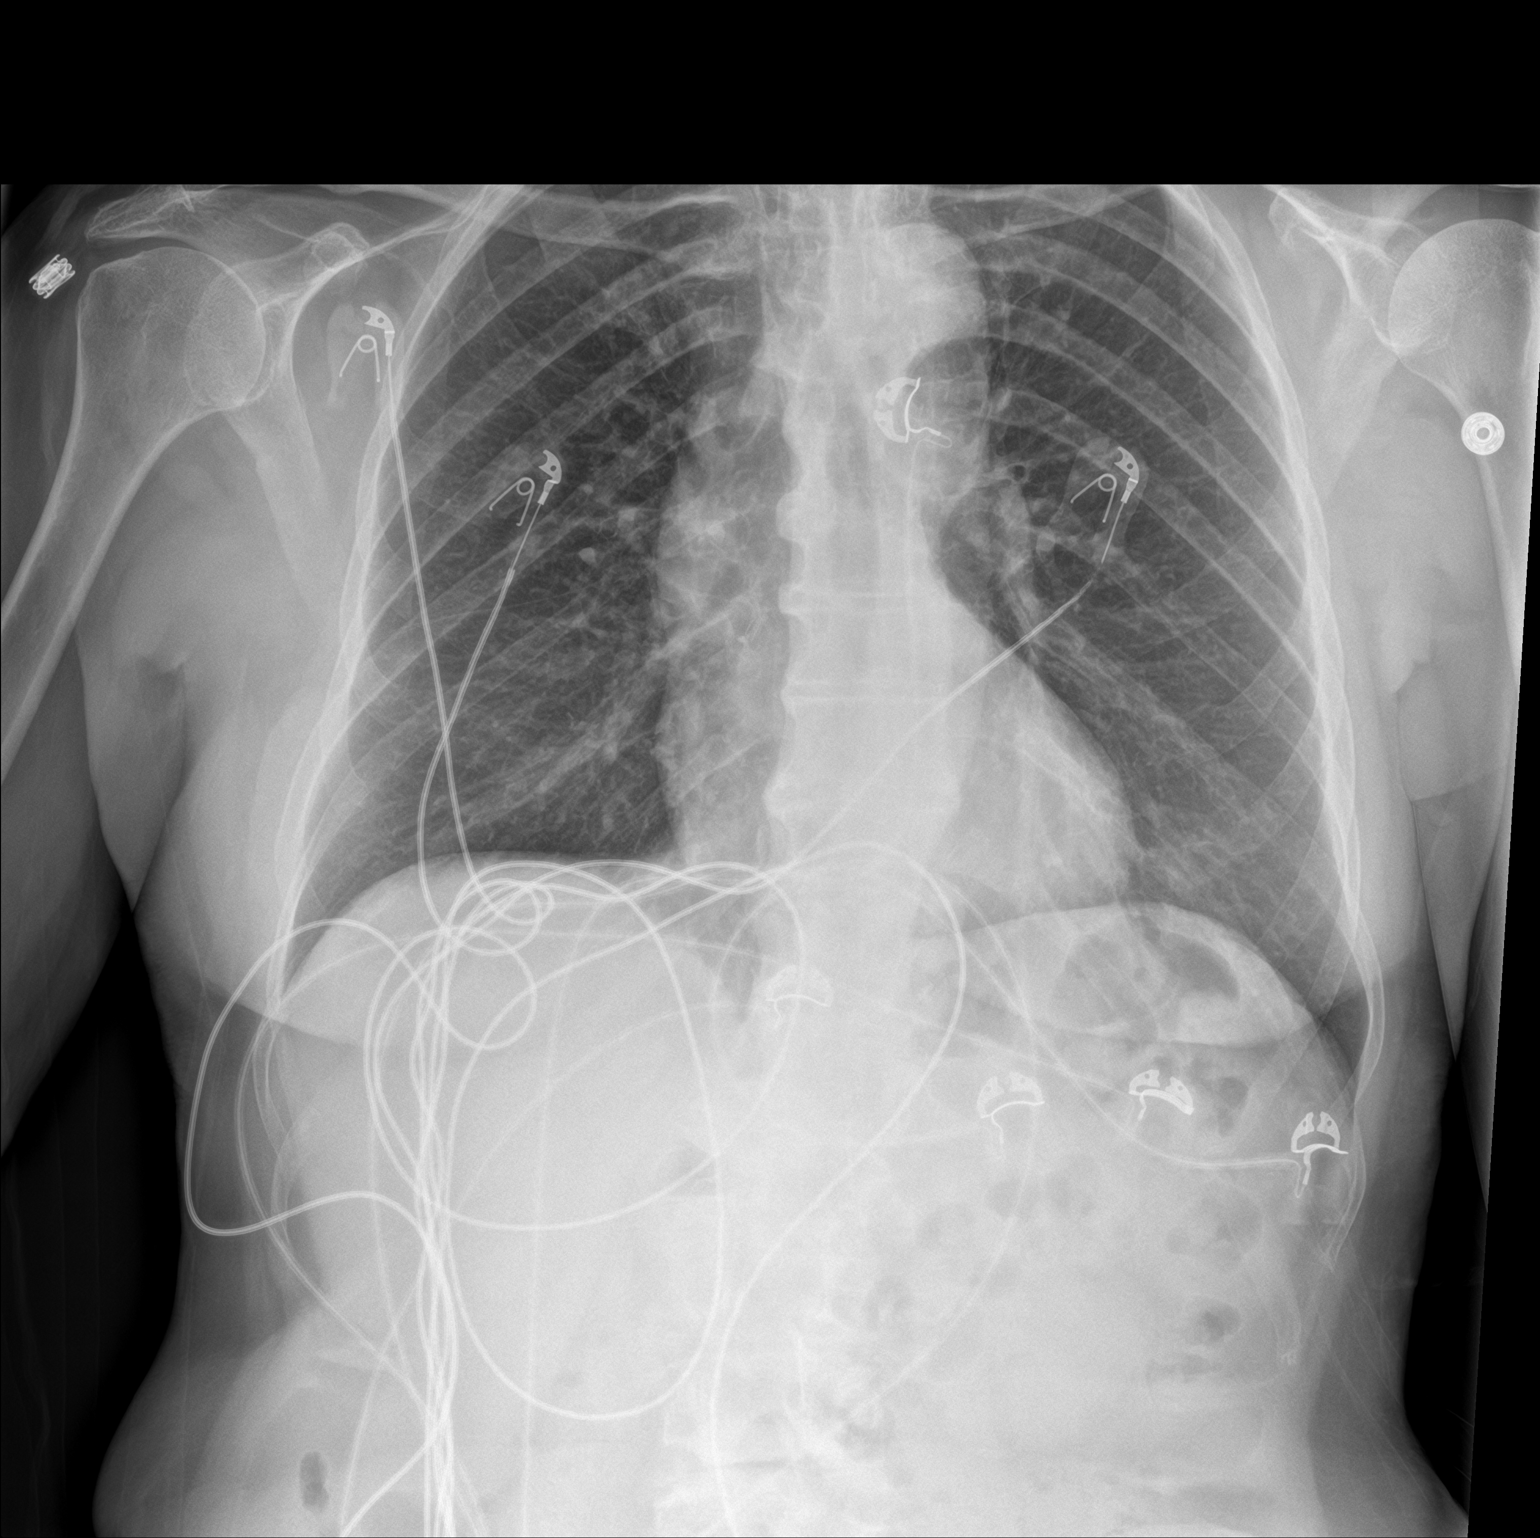

[chest lat]
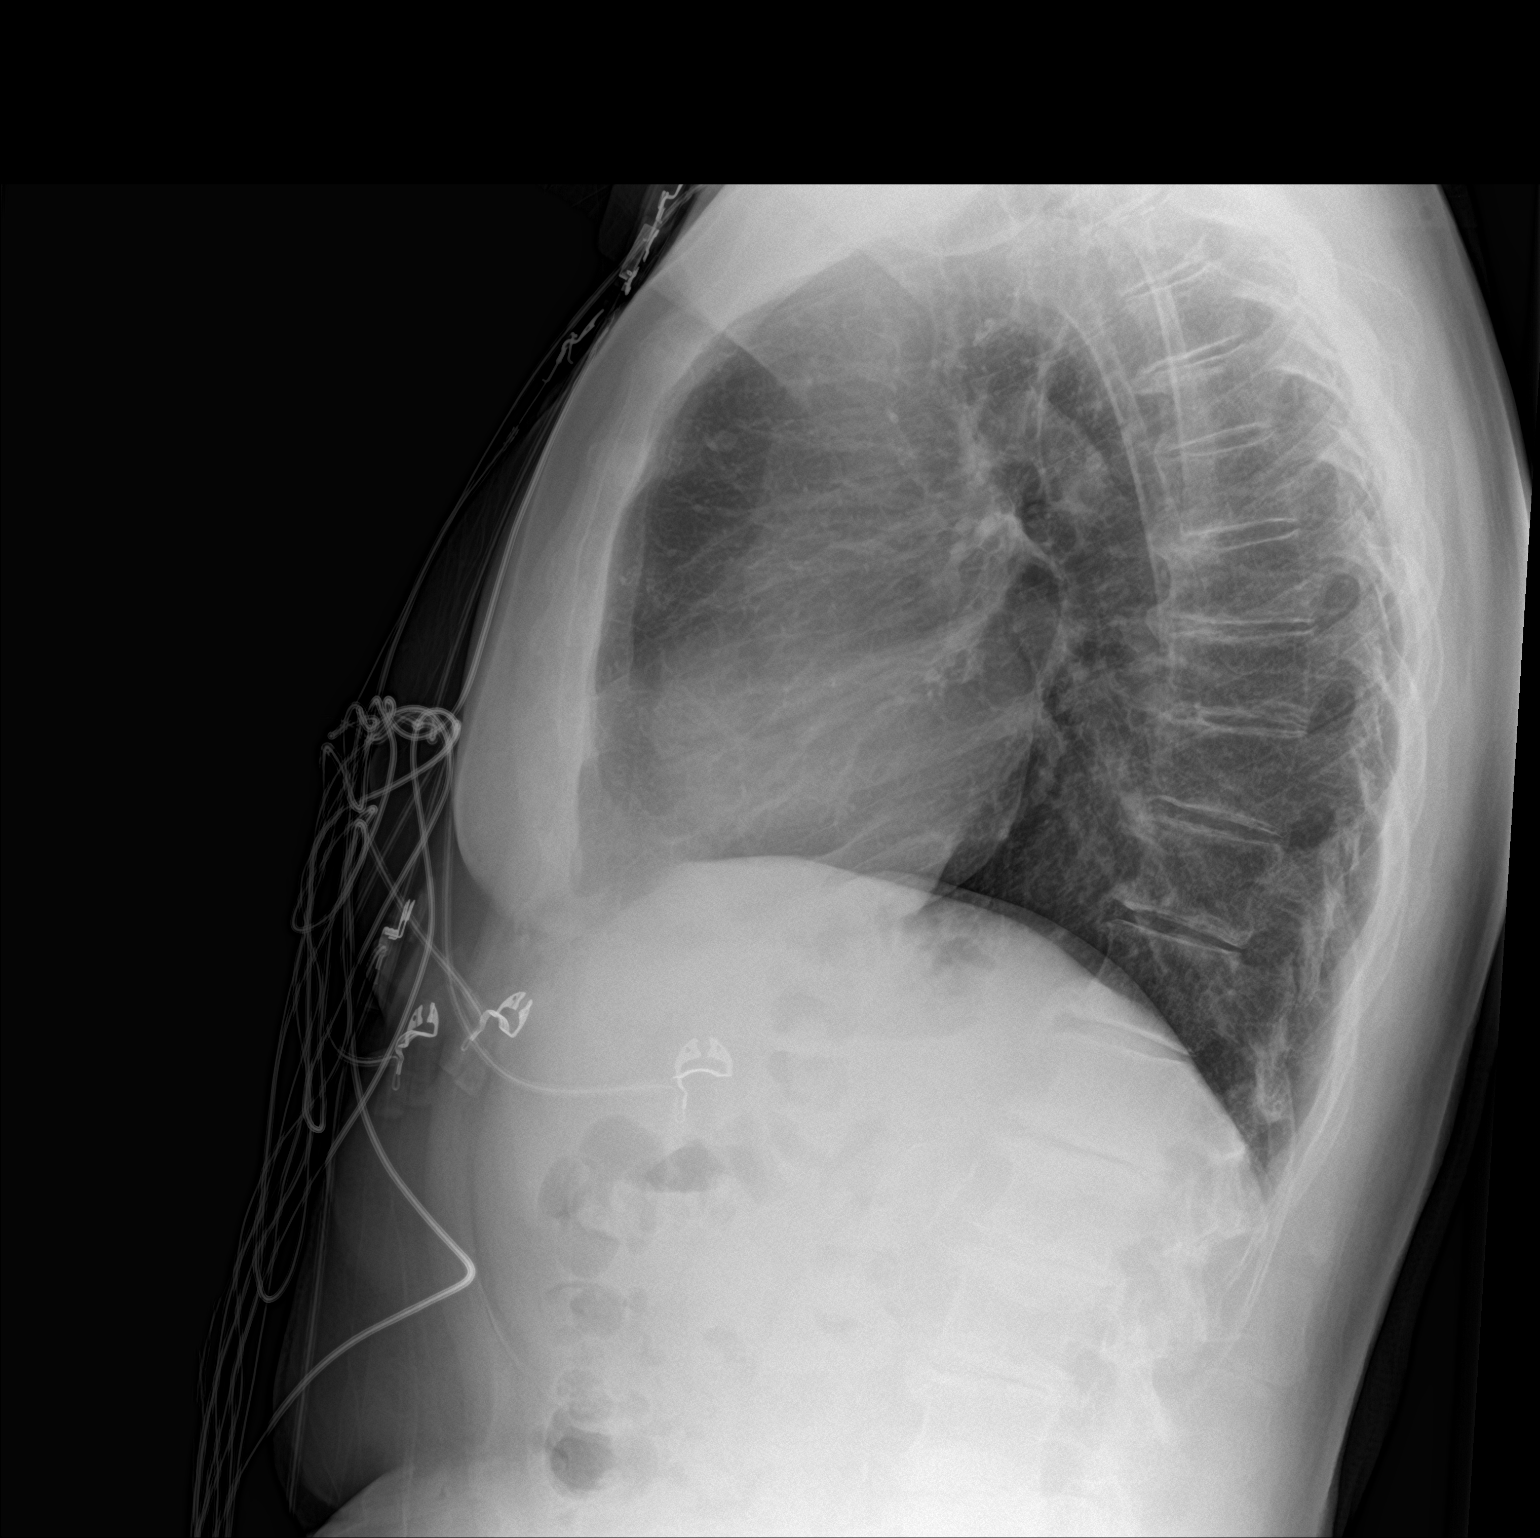

[2 of 2 positions shown; findings below may reference images not displayed]

FINDINGS: Cardiac size is within normal limits. Thoracic aorta is tortuous and
ectatic. Lung fields are clear of any pulmonary edema or focal
consolidation. There is no pleural effusion or pneumothorax.
IMPRESSION: No active cardiopulmonary disease.
